# Patient Record
Sex: Male | Born: 1990 | Race: Black or African American | Hispanic: No | Marital: Single | Smoking: Never smoker
Health system: Southern US, Community
[De-identification: ages and names within clinical notes are randomized; demographics above are authoritative.]

---
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
## 2018-10-30 ENCOUNTER — Emergency Department (HOSPITAL_COMMUNITY)
Admission: EM | Admit: 2018-10-30 | Discharge: 2018-10-30 | Disposition: A | Discharge status: Home / Self Care | Payer: PRIVATE HEALTH INSURANCE | Attending: Pediatric Emergency Medicine | Admitting: Pediatric Emergency Medicine

## 2018-10-30 ENCOUNTER — Encounter (HOSPITAL_COMMUNITY): Payer: Self-pay | Admitting: *Deleted

## 2018-10-30 ENCOUNTER — Other Ambulatory Visit: Payer: Self-pay

## 2018-10-30 ENCOUNTER — Emergency Department (HOSPITAL_COMMUNITY): Payer: PRIVATE HEALTH INSURANCE

## 2018-10-30 DIAGNOSIS — Y9367 Activity, basketball: Secondary | ICD-10-CM | POA: Insufficient documentation

## 2018-10-30 DIAGNOSIS — Y92310 Basketball court as the place of occurrence of the external cause: Secondary | ICD-10-CM | POA: Insufficient documentation

## 2018-10-30 DIAGNOSIS — S93402A Sprain of unspecified ligament of left ankle, initial encounter: Secondary | ICD-10-CM | POA: Insufficient documentation

## 2018-10-30 DIAGNOSIS — Y999 Unspecified external cause status: Secondary | ICD-10-CM | POA: Insufficient documentation

## 2018-10-30 DIAGNOSIS — X58XXXA Exposure to other specified factors, initial encounter: Secondary | ICD-10-CM | POA: Insufficient documentation

## 2018-10-30 MED ORDER — IBUPROFEN 400 MG PO TABS: 600.0000 mg | ORAL_TABLET | Freq: Once | ORAL | Status: DC

## 2018-10-30 MED ORDER — IBUPROFEN 400 MG PO TABS
600.0000 mg | ORAL_TABLET | Freq: Once | ORAL | Status: AC
  Administered 2018-10-30: 600 mg via ORAL
  Filled 2018-10-30: qty 1

## 2018-10-30 NOTE — Progress Notes (Signed)
Orthopedic Tech Progress Note Patient Details:  Douglas Franco [birth year] [id number]  Ortho Devices Type of Ortho Device: Ankle Air splint, Crutches Ortho Device/Splint Location: lle Ortho Device/Splint Interventions: Ordered, Application, Adjustment   Post Interventions Patient Tolerated: Well Instructions Provided: Care of device, Adjustment of device   Karolee Stamps 10/30/2018, 3:12 AM

## 2018-10-30 NOTE — ED Triage Notes (Signed)
Pt reports injuring left ankle while playing bball today, pain when ambulating.

## 2018-10-30 NOTE — ED Notes (Signed)
Ortho paged. 

## 2018-10-30 NOTE — ED Provider Notes (Signed)
[facility]   CSN: [id number] Arrival date & time: 10/30/18  0149    History   Chief Complaint Chief Complaint  Patient presents with  . Ankle Pain    HPI Douglas Franco is a 28 y.o. male.     HPI  28 year old male otherwise healthy history of left ankle sprain playing basketball on day of presentation with left ankle injury.  Unable to ambulate so presents.  No fevers cough or other sick symptoms.  Attempted pain relief with Tylenol and tramadol at home with minimal improvement.  No other injuries.  History reviewed. No pertinent past medical history.  There are no active problems to display for this patient.   History reviewed. No pertinent surgical history.      Home Medications    Prior to Admission medications   Not on File    Family History History reviewed. No pertinent family history.  Social History Social History   Tobacco Use  . Smoking status: Not on file  Substance Use Topics  . Alcohol use: Not on file  . Drug use: Not on file     Allergies   Patient has no known allergies.   Review of Systems Review of Systems  Constitutional: Positive for activity change. Negative for fever.  Respiratory: Negative for cough.   Cardiovascular: Negative for chest pain.  Gastrointestinal: Negative for abdominal pain, diarrhea and vomiting.  Musculoskeletal: Positive for arthralgias, gait problem and joint swelling. Negative for back pain and neck pain.  Skin: Negative for rash and wound.  Neurological: Negative for dizziness and headaches.     Physical Exam Updated Vital Signs BP 128/75 (BP Location: Left Arm)   Pulse 96   Temp 99.8 F (37.7 C) (Oral)   Resp 16   SpO2 97%   Physical Exam Vitals signs and nursing note reviewed.  Constitutional:      Appearance: He is well-developed.  HENT:     Head: Normocephalic and atraumatic.  Eyes:     Conjunctiva/sclera: Conjunctivae normal.   Neck:     Musculoskeletal: Neck supple.  Cardiovascular:     Rate and Rhythm: Normal rate and regular rhythm.     Heart sounds: No murmur.  Pulmonary:     Effort: Pulmonary effort is normal. No respiratory distress.     Breath sounds: Normal breath sounds.  Abdominal:     Palpations: Abdomen is soft.     Tenderness: There is no abdominal tenderness.  Musculoskeletal:        General: Swelling (Left ankle swelling with limitation to range of motion at left ankle secondary to pain) present.     Comments: 2-second capillary refill to 5 digits distal to left ankle injury 2+ DP pulses intact warm with normal sensation  Skin:    General: Skin is warm and dry.     Capillary Refill: Capillary refill takes less than 2 seconds.  Neurological:     Mental Status: He is alert and oriented to person, place, and time.     Cranial Nerves: No cranial nerve deficit.     Motor: No weakness.     Gait: Gait abnormal.     Deep Tendon Reflexes: Reflexes normal.      ED Treatments / Results  Labs (all labs ordered are listed, but only abnormal results are displayed) Labs Reviewed - No data to display  EKG None  Radiology Dg Ankle Complete Left  Result Date: 10/30/2018 CLINICAL DATA:  Pain, injury  EXAM: LEFT ANKLE COMPLETE - 3+ VIEW COMPARISON:  None. FINDINGS: Prominent soft tissue swelling. No acute displaced fracture or malalignment. Ankle mortise is symmetric IMPRESSION: Soft tissue swelling.  No definite acute osseous abnormality Electronically Signed   By: Donavan Foil M.D.   On: 10/30/2018 02:25    Procedures Procedures (including critical care time)  Medications Ordered in ED Medications  ibuprofen (ADVIL) tablet 600 mg (600 mg Oral Given 10/30/18 0230)     Initial Impression / Assessment and Plan / ED Course  I have reviewed the triage vital signs and the nursing notes.  Pertinent labs & imaging results that were available during my care of the patient were reviewed by me and  considered in my medical decision making (see chart for details).        Pt is a 28 y.o. male with  pertinent PMHX of left ankle sprain who presents w/ a left ankle sprain.   Hemodynamically appropriate and stable on room air with normal saturations.  Lungs clear to auscultation bilaterally good air exchange.  Normal cardiac exam.  Benign abdomen.  No hip pain no knee pain bilaterally.  Left ankle tender to palpation  Patient has no obvious deformity on exam. Patient neurovascularly intact - good pulses, full movement - slightly decreased only 2/2 pain. Imaging obtained and resulted above.  Doubt nerve or vascular injury at this time.  No other injuries appreciated on exam.  Radiology read as above.  No fractures.  I personally reviewed and agree.  Pain control with Motrin here.  Patient placed in Aircast and provided crutches instruction.  D/C home in stable condition. Follow-up with PCP   Final Clinical Impressions(s) / ED Diagnoses   Final diagnoses:  Sprain of left ankle, unspecified ligament, initial encounter    ED Discharge Orders    None       Cyndie Woodbeck, Lillia Carmel, MD 10/30/18 [phone]

## 2018-11-21 ENCOUNTER — Encounter (HOSPITAL_COMMUNITY): Payer: Self-pay | Admitting: Emergency Medicine

## 2018-11-21 ENCOUNTER — Other Ambulatory Visit: Payer: Self-pay

## 2018-11-21 ENCOUNTER — Emergency Department (HOSPITAL_COMMUNITY)
Admission: EM | Admit: 2018-11-21 | Discharge: 2018-11-21 | Disposition: A | Discharge status: Home / Self Care | Payer: Self-pay | Attending: Emergency Medicine | Admitting: Emergency Medicine

## 2018-11-21 DIAGNOSIS — Z09 Encounter for follow-up examination after completed treatment for conditions other than malignant neoplasm: Secondary | ICD-10-CM | POA: Insufficient documentation

## 2018-11-21 NOTE — ED Triage Notes (Signed)
Pt. Stated, I need a note to go back to work from 3 weeks ago,. I hurt my left ankile and it just took longer.

## 2018-11-21 NOTE — ED Provider Notes (Signed)
[facility] EMERGENCY DEPARTMENT Provider Note   CSN: [id number] Arrival date & time: 11/21/18  1011    History   Chief Complaint Chief Complaint  Patient presents with  . Letter for School/Work    HPI Douglas Franco is a 28 y.o. male.     HPI   28 year old male presents today for work note.  He was seen on 10/30/2018 after spraining his ankle.  He notes that he has had improvement his pain since that time.  He notes some minor soreness to the ankle no severe pain.  He has not gone to work since that time.  He tried to go back to work and was told he needed a note to return.  He denies any significant complaints presently. History reviewed. No pertinent past medical history.  There are no active problems to display for this patient.   History reviewed. No pertinent surgical history.      Home Medications    Prior to Admission medications   Not on File    Family History No family history on file.  Social History Social History   Tobacco Use  . Smoking status: Never Smoker  . Smokeless tobacco: Never Used  Substance Use Topics  . Alcohol use: Not on file  . Drug use: Not on file     Allergies   Patient has no known allergies.   Review of Systems Review of Systems  All other systems reviewed and are negative.    Physical Exam Updated Vital Signs BP 121/71 (BP Location: Right Arm)   Pulse 80   Temp 98.7 F (37.1 C)   Resp 17   Ht 5\' 11"  (1.803 m)   Wt 124.7 kg   SpO2 98%   BMI 38.35 kg/m   Physical Exam Vitals signs and nursing note reviewed.  Constitutional:      Appearance: He is well-developed.  HENT:     Head: Normocephalic and atraumatic.  Eyes:     General: No scleral icterus.       Right eye: No discharge.        Left eye: No discharge.     Conjunctiva/sclera: Conjunctivae normal.     Pupils: Pupils are equal, round, and reactive to light.  Neck:     Musculoskeletal: Normal range of motion.     Vascular:  No JVD.     Trachea: No tracheal deviation.  Pulmonary:     Effort: Pulmonary effort is normal.     Breath sounds: No stridor.  Musculoskeletal:     Comments: Left ankle without swelling or edema nontender to palpation full active range of motion  Neurological:     Mental Status: He is alert and oriented to person, place, and time.     Coordination: Coordination normal.  Psychiatric:        Behavior: Behavior normal.        Thought Content: Thought content normal.        Judgment: Judgment normal.      ED Treatments / Results  Labs (all labs ordered are listed, but only abnormal results are displayed) Labs Reviewed - No data to display  EKG None  Radiology No results found.  Procedures Procedures (including critical care time)  Medications Ordered in ED Medications - No data to display   Initial Impression / Assessment and Plan / ED Course  I have reviewed the triage vital signs and the nursing notes.  Pertinent labs & imaging results that were available  during my care of the patient were reviewed by me and considered in my medical decision making (see chart for details).        Patient here for work note, he has no complaints.  Final Clinical Impressions(s) / ED Diagnoses   Final diagnoses:  Follow-up exam    ED Discharge Orders    None       Eyvonne MechanicHedges, Caniyah Murley, Cordelia Poche-C 11/21/18 1102    Tegeler, Canary Brimhristopher J, MD 11/21/18 [phone]

## 2020-08-22 IMAGING — DX LEFT ANKLE COMPLETE - 3+ VIEW
3 series · 3 of 3 positions shown · non-contrast
Comparison: None.

CLINICAL DATA: Pain, injury

EXAM:
LEFT ANKLE COMPLETE - 3+ VIEW

[ankle ap]
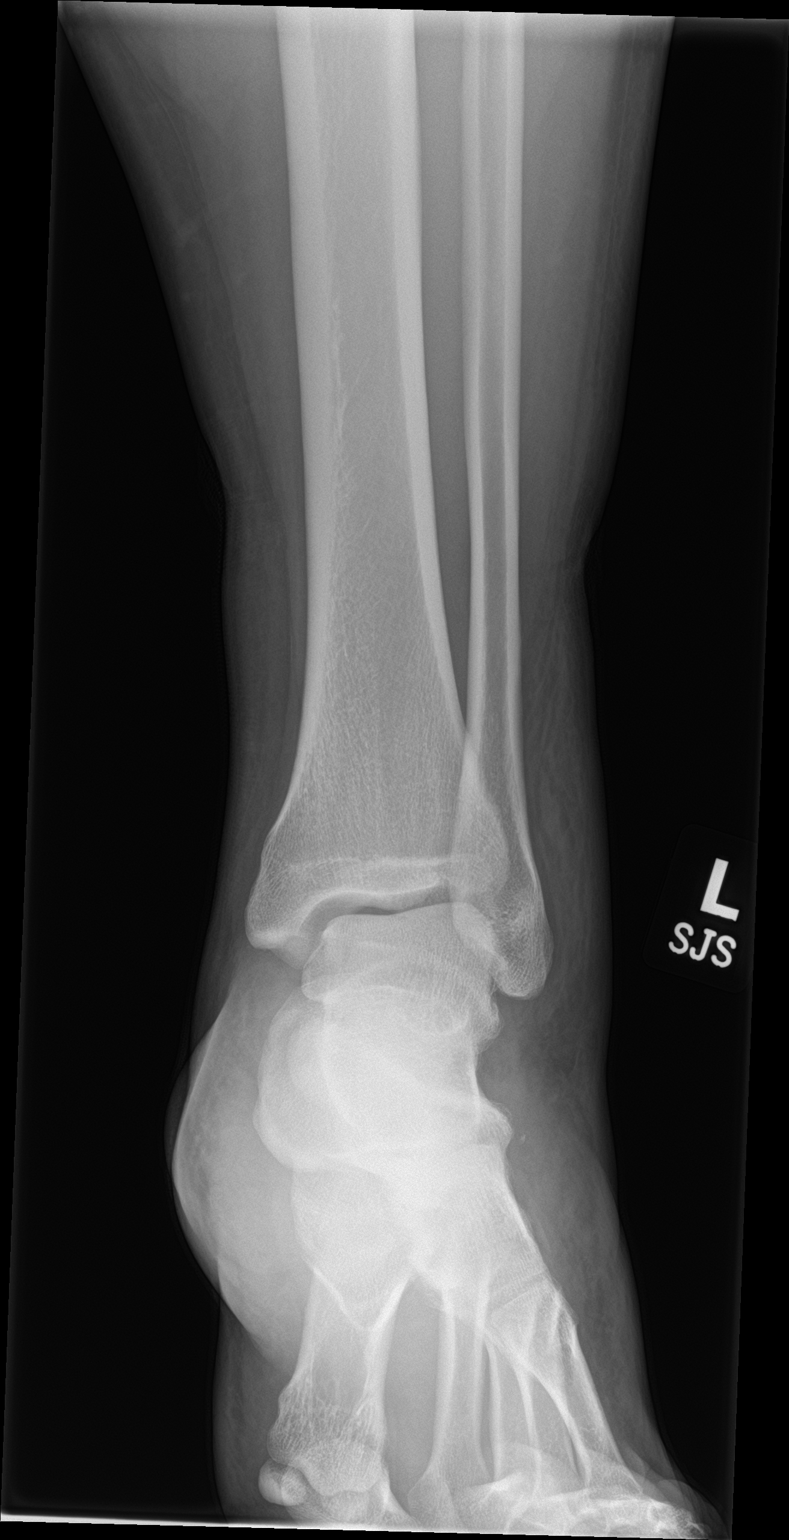

[ankle obl]
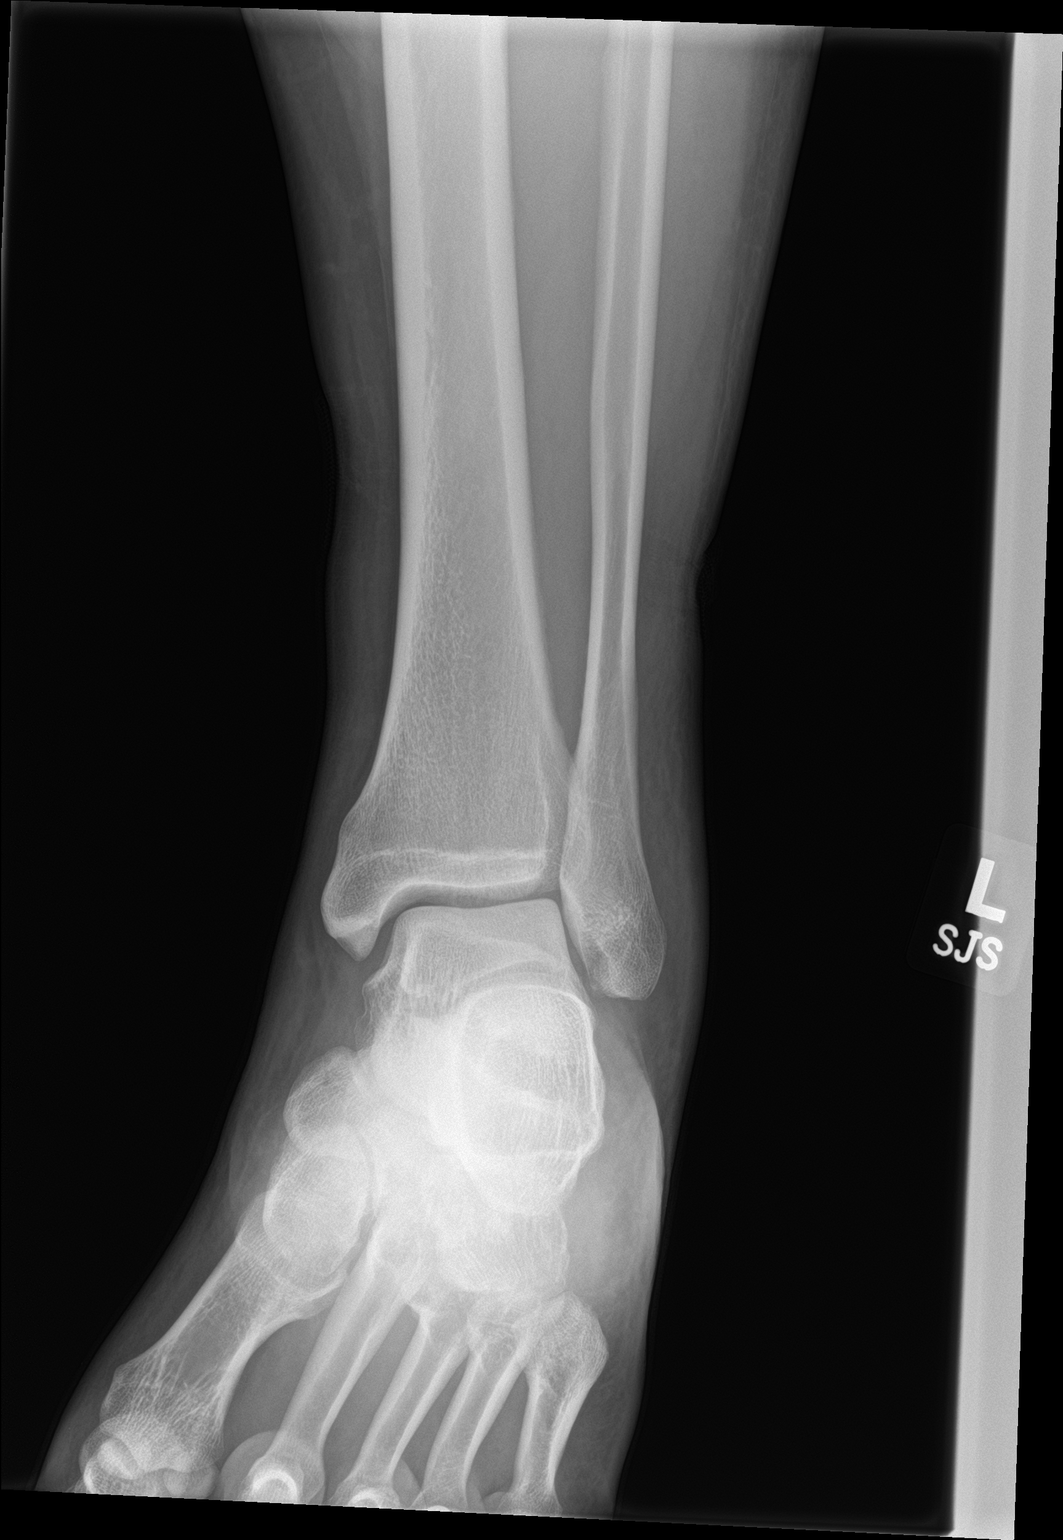

[ankle lat]
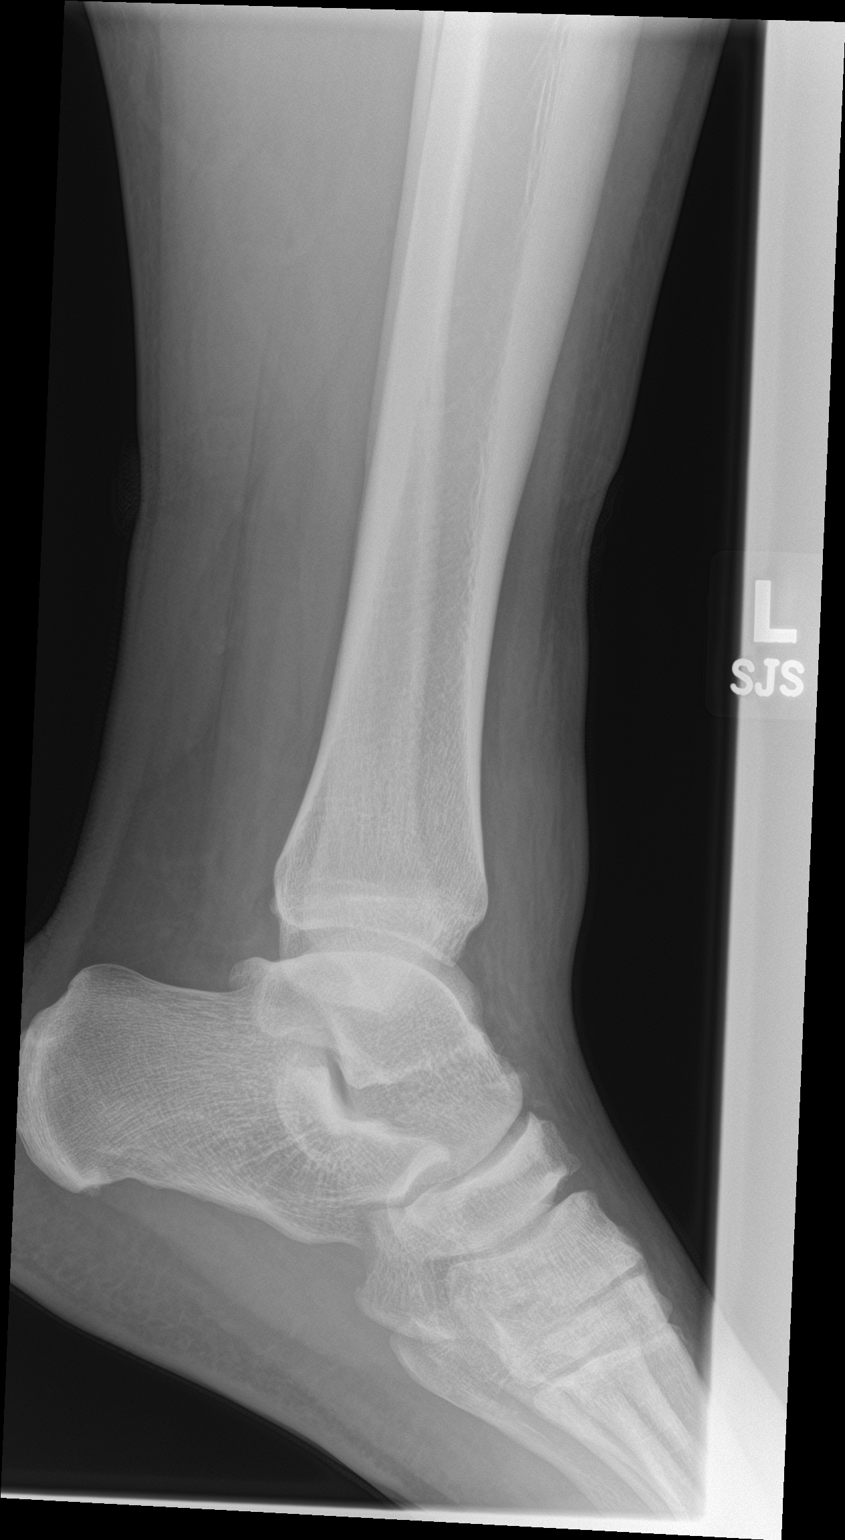

[3 of 3 positions shown; findings below may reference images not displayed]

FINDINGS: Prominent soft tissue swelling. No acute displaced fracture or
malalignment. Ankle mortise is symmetric
IMPRESSION: Soft tissue swelling.  No definite acute osseous abnormality
